# Patient Record
Sex: Male | Born: 1980 | Race: White | Hispanic: No | Marital: Married | Smoking: Never smoker
Health system: Southern US, Community
[De-identification: ages and names within clinical notes are randomized; demographics above are authoritative.]

## PROBLEM LIST (undated history)

## (undated) DIAGNOSIS — R194 Change in bowel habit: Secondary | ICD-10-CM

## (undated) DIAGNOSIS — R197 Diarrhea, unspecified: Secondary | ICD-10-CM

## (undated) DIAGNOSIS — R109 Unspecified abdominal pain: Secondary | ICD-10-CM

## (undated) DIAGNOSIS — I10 Essential (primary) hypertension: Secondary | ICD-10-CM

## (undated) DIAGNOSIS — K5900 Constipation, unspecified: Secondary | ICD-10-CM

## (undated) DIAGNOSIS — L509 Urticaria, unspecified: Secondary | ICD-10-CM

## (undated) HISTORY — DX: Urticaria, unspecified: L50.9

## (undated) HISTORY — DX: Constipation, unspecified: K59.00

## (undated) HISTORY — DX: Change in bowel habit: R19.4

## (undated) HISTORY — DX: Unspecified abdominal pain: R10.9

## (undated) HISTORY — PX: CLOSED REDUCTION WRIST FRACTURE: SHX1091

## (undated) HISTORY — DX: Diarrhea, unspecified: R19.7

## (undated) HISTORY — DX: Essential (primary) hypertension: I10

---

## 2005-03-29 ENCOUNTER — Emergency Department (HOSPITAL_COMMUNITY): Admission: EM | Admit: 2005-03-29 | Discharge: 2005-03-29 | Payer: Self-pay | Admitting: Emergency Medicine

## 2009-04-05 HISTORY — PX: VARICOCELECTOMY: SHX1084

## 2009-07-29 ENCOUNTER — Ambulatory Visit (HOSPITAL_COMMUNITY): Admission: RE | Admit: 2009-07-29 | Discharge: 2009-07-29 | Payer: Self-pay | Admitting: Internal Medicine

## 2009-09-03 ENCOUNTER — Ambulatory Visit (HOSPITAL_BASED_OUTPATIENT_CLINIC_OR_DEPARTMENT_OTHER): Admission: RE | Admit: 2009-09-03 | Discharge: 2009-09-03 | Payer: Self-pay | Admitting: Urology

## 2014-10-01 ENCOUNTER — Ambulatory Visit (INDEPENDENT_AMBULATORY_CARE_PROVIDER_SITE_OTHER)
Admission: RE | Admit: 2014-10-01 | Discharge: 2014-10-01 | Disposition: A | Discharge status: Home / Self Care | Payer: BC Managed Care – PPO | Source: Ambulatory Visit | Attending: Family Medicine | Admitting: Family Medicine

## 2014-10-01 ENCOUNTER — Other Ambulatory Visit (INDEPENDENT_AMBULATORY_CARE_PROVIDER_SITE_OTHER): Payer: BC Managed Care – PPO

## 2014-10-01 ENCOUNTER — Encounter: Payer: Self-pay | Admitting: Family Medicine

## 2014-10-01 ENCOUNTER — Ambulatory Visit (INDEPENDENT_AMBULATORY_CARE_PROVIDER_SITE_OTHER): Payer: BC Managed Care – PPO | Admitting: Family Medicine

## 2014-10-01 VITALS — BP 134/80 | HR 79 | Ht 77.0 in | Wt 319.0 lb

## 2014-10-01 DIAGNOSIS — M25531 Pain in right wrist: Secondary | ICD-10-CM | POA: Diagnosis not present

## 2014-10-01 DIAGNOSIS — S62111A Displaced fracture of triquetrum [cuneiform] bone, right wrist, initial encounter for closed fracture: Secondary | ICD-10-CM

## 2014-10-01 DIAGNOSIS — S62113A Displaced fracture of triquetrum [cuneiform] bone, unspecified wrist, initial encounter for closed fracture: Secondary | ICD-10-CM | POA: Insufficient documentation

## 2014-10-01 NOTE — Assessment & Plan Note (Signed)
Patient was put in a removable splint at this time. We discussed about proper casting the patient declined. We will get x-rays to further evaluate to make sure there is no significant displacement. Patient will likely do well with immobilization. We discussed icing protocol, mild range of motion exercises, as well as vitamin D supplementation. Prognosis is good.

## 2014-10-01 NOTE — Progress Notes (Signed)
Pre visit review using our clinic review tool, if applicable. No additional management support is needed unless otherwise documented below in the visit note. 

## 2014-10-01 NOTE — Progress Notes (Signed)
Mario Kelley D.O. [facility] [address] [place], [place] [address] Phone: [phone] Subjective:     CC: Right wrist pain  OZH:YQMVHQIONGHPI:Subjective Mario Kelley is a 34 y.o. male coming in with complaint of right wrist pain. Patient states that about 8 weeks ago he was holding hands with his wife and hit it against a barrel. Patient states that this started hurt at that time. Since then anytime he tries to put pressure on it with the wrist in full extension he has pain. Mostly on the dorsal aspect of the wrist. No swelling, no numbness and tingling. he states though that unfortunately and does not seem to be improving quickly. Rates the severity of pain as 5 out of 10. States that it sometimes gives him some discomfort with work but not truly pain. Patient used to be a significant more active before the injury. Patient has seen a physical therapist and since then has not notice any improvement.  Past Medical History  Diagnosis Date  . Hypertension    Past Surgical History  Procedure Laterality Date  . Varicocelectomy  2011   History  Substance Use Topics  . Smoking status: Never Smoker   . Smokeless tobacco: Never Used  . Alcohol Use: No   History reviewed. No pertinent family history. Not on File     Past medical history, social, surgical and family history all reviewed in electronic medical record.   Review of Systems: No headache, visual changes, nausea, vomiting, diarrhea, constipation, dizziness, abdominal pain, skin rash, fevers, chills, night sweats, weight loss, swollen lymph nodes, body aches, joint swelling, muscle aches, chest pain, shortness of breath, mood changes.   Objective Blood pressure 134/80, pulse 79, height 6\' 5"  (1.956 m), weight 319 lb (144.697 kg), SpO2 97 %.  General: No apparent distress alert and oriented x3 mood and affect normal, dressed appropriately.  HEENT: Pupils equal, extraocular movements intact  Respiratory: Patient's speak in  full sentences and does not appear short of breath  Cardiovascular: No lower extremity edema, non tender, no erythema  Skin: Warm dry intact with no signs of infection or rash on extremities or on axial skeleton.  Abdomen: Soft nontender  Neuro: Cranial nerves II through XII are intact, neurovascularly intact in all extremities with 2+ DTRs and 2+ pulses.  Lymph: No lymphadenopathy of posterior or anterior cervical chain or axillae bilaterally.  Gait normal with good balance and coordination.  MSK:  Non tender with full range of motion and good stability and symmetric strength and tone of shoulders, elbows, hip, knee and ankles bilaterally.  Wrist: right Inspection normal with no visible erythema or swelling. ROM smooth and normal with good flexion and extension and ulnar/radial deviation that is symmetrical with opposite wrist. Palpation is normal over metacarpals, navicular, lunate, and TFCC; tendons without tenderness/ swelling but mild pain over triquetrum.  No snuffbox tenderness. No tenderness over Canal of Guyon. Strength 5/5 in all directions without pain. Negative Finkelstein, tinel's and phalens. Negative Watson's test.   MSK US performed of: right This study was ordered, performed, and interpreted by Terrilee FilesZach Waynesha Rammel D.O.  Wrist: All extensor compartments visualized and tendons all normal in appearance without fraying, tears, or sheath effusions. Patient does have triquetrum does have what appears to be a full bone cortical defect with some mild displaced. Hypoechoic changes surrounding the area with minimal Doppler flow. No effusion seen. TFCC intact. Scapholunate ligament intact. Carpal tunnel visualized and median nerve area normal, flexor tendons all normal in  appearance without fraying, tears, or sheath effusions.   IMPRESSION: triquetrum fracture,      Impression and Recommendations:     This case required medical decision making of moderate complexity.

## 2014-10-01 NOTE — Patient Instructions (Addendum)
Good to see you.  Ice 20 minutes 2 times daily. Usually after activity and before bed. Wear brace with activity and at night.  Can come out of brace 1-2 times to move wrist daily Try to avoid loading wrist for next 3 weeks without brace.  See me again in 3 weeks before vacation.

## 2014-10-22 ENCOUNTER — Encounter: Payer: Self-pay | Admitting: Family Medicine

## 2014-10-22 ENCOUNTER — Ambulatory Visit (INDEPENDENT_AMBULATORY_CARE_PROVIDER_SITE_OTHER): Payer: BC Managed Care – PPO | Admitting: Family Medicine

## 2014-10-22 VITALS — BP 138/82 | HR 65 | Wt 311.0 lb

## 2014-10-22 DIAGNOSIS — M25531 Pain in right wrist: Secondary | ICD-10-CM

## 2014-10-22 DIAGNOSIS — S62111D Displaced fracture of triquetrum [cuneiform] bone, right wrist, subsequent encounter for fracture with routine healing: Secondary | ICD-10-CM | POA: Diagnosis not present

## 2014-10-22 NOTE — Patient Instructions (Signed)
Good to see you Doing great Wear brace out of the house for 2 weeks Sleep in it for next 5 days OK not to wear it in the house Continue vitamin D Start lifting at 25% in 1-2 weeks and increase 10% a week See me again in 3 weeks if not perfect

## 2014-10-22 NOTE — Progress Notes (Signed)
  Tawana ScaleZach Kelley D.O. [facility] [address] [place], [place] [address] Phone: [phone] Subjective:     CC: Right wrist pain  BJY:NWGNFAOZHYHPI:Subjective Baird KayJerry Wayne Kelley is a 34 y.o. male coming in with complaint of right wrist pain. Patient did have a triquetrum fracture. Patient is feeling better but has some soreness as he wears the brace. Patient has been wearing the brace diligently over the course last 3 weeks. Denies any new symptoms.  Past Medical History  Diagnosis Date  . Hypertension    Past Surgical History  Procedure Laterality Date  . Varicocelectomy  2011   History  Substance Use Topics  . Smoking status: Never Smoker   . Smokeless tobacco: Never Used  . Alcohol Use: No   No family history on file. Not on File     Past medical history, social, surgical and family history all reviewed in electronic medical record.   Review of Systems: No headache, visual changes, nausea, vomiting, diarrhea, constipation, dizziness, abdominal pain, skin rash, fevers, chills, night sweats, weight loss, swollen lymph nodes, body aches, joint swelling, muscle aches, chest pain, shortness of breath, mood changes.   Objective Blood pressure 138/82, pulse 65, weight 311 lb (141.069 kg), SpO2 99 %.  General: No apparent distress alert and oriented x3 mood and affect normal, dressed appropriately.  HEENT: Pupils equal, extraocular movements intact  Respiratory: Patient's speak in full sentences and does not appear short of breath  Cardiovascular: No lower extremity edema, non tender, no erythema  Skin: Warm dry intact with no signs of infection or rash on extremities or on axial skeleton.  Abdomen: Soft nontender  Neuro: Cranial nerves II through XII are intact, neurovascularly intact in all extremities with 2+ DTRs and 2+ pulses.  Lymph: No lymphadenopathy of posterior or anterior cervical chain or axillae bilaterally.  Gait normal with good balance and coordination.  MSK:  Non  tender with full range of motion and good stability and symmetric strength and tone of shoulders, elbows, hip, knee and ankles bilaterally.  Wrist: right Inspection normal with no visible erythema or swelling. ROM smooth and normal with good flexion and extension and ulnar/radial deviation that is symmetrical with opposite wrist. Palpation is normal over metacarpals, navicular, lunate, and TFCC; tendons without tenderness nontender over the triquetrum today No snuffbox tenderness. No tenderness over Canal of Guyon. Strength 5/5 in all directions without pain. Negative Finkelstein, tinel's and phalens. Negative Watson's test.   MSK US performed of: right This study was ordered, performed, and interpreted by Terrilee FilesZach Kelley D.O.  Wrist: All extensor compartments visualized and tendons all normal in appearance without fraying, tears, or sheath effusions. Patient on longer has a court or defect over the triquetrum. Patient does have reabsorption occurring. Mild overlying hypoechoic changes still exists. No effusion seen. TFCC intact. Scapholunate ligament intact. Carpal tunnel visualized and median nerve area normal, flexor tendons all normal in appearance without fraying, tears, or sheath effusions.   IMPRESSION: Triquetrum fracture healing     Impression and Recommendations:     This case required medical decision making of moderate complexity.

## 2014-10-22 NOTE — Assessment & Plan Note (Addendum)
Condition is feeling much better. Patient was given an exercise prescription will start wearing the immobilizing brace less and less frequently. Patient is over the course of next 3 weeks. Continue the vitamin D supplementation as well as icing. Patient will follow-up with me again in 3 weeks if not pain-free.

## 2014-11-12 ENCOUNTER — Ambulatory Visit: Payer: BC Managed Care – PPO | Admitting: Family Medicine

## 2015-05-06 ENCOUNTER — Other Ambulatory Visit (HOSPITAL_COMMUNITY): Payer: Self-pay | Admitting: Internal Medicine

## 2015-05-06 DIAGNOSIS — R109 Unspecified abdominal pain: Secondary | ICD-10-CM

## 2015-05-09 ENCOUNTER — Ambulatory Visit (HOSPITAL_COMMUNITY)
Admission: RE | Admit: 2015-05-09 | Discharge: 2015-05-09 | Disposition: A | Discharge status: Home / Self Care | Payer: BC Managed Care – PPO | Source: Ambulatory Visit | Attending: Internal Medicine | Admitting: Internal Medicine

## 2015-05-09 DIAGNOSIS — R109 Unspecified abdominal pain: Secondary | ICD-10-CM | POA: Diagnosis present

## 2015-05-15 ENCOUNTER — Other Ambulatory Visit (HOSPITAL_COMMUNITY): Payer: Self-pay | Admitting: Internal Medicine

## 2015-05-15 DIAGNOSIS — R109 Unspecified abdominal pain: Secondary | ICD-10-CM

## 2015-06-02 ENCOUNTER — Encounter (HOSPITAL_COMMUNITY)
Admission: RE | Admit: 2015-06-02 | Discharge: 2015-06-02 | Disposition: A | Discharge status: Home / Self Care | Payer: BC Managed Care – PPO | Source: Ambulatory Visit | Attending: Internal Medicine | Admitting: Internal Medicine

## 2015-06-02 DIAGNOSIS — R109 Unspecified abdominal pain: Secondary | ICD-10-CM | POA: Insufficient documentation

## 2015-06-02 MED ORDER — SINCALIDE 5 MCG IJ SOLR
0.0200 ug/kg | Freq: Once | INTRAMUSCULAR | Status: AC
  Administered 2015-06-02: 2 ug via INTRAVENOUS

## 2015-06-02 MED ORDER — TECHNETIUM TC 99M MEBROFENIN IV KIT
5.0000 | PACK | Freq: Once | INTRAVENOUS | Status: AC | PRN
  Administered 2015-06-02: 5 via INTRAVENOUS

## 2015-06-06 ENCOUNTER — Encounter: Payer: Self-pay | Admitting: Internal Medicine

## 2015-06-23 ENCOUNTER — Encounter: Payer: Self-pay | Admitting: Gastroenterology

## 2015-06-24 ENCOUNTER — Ambulatory Visit: Payer: BC Managed Care – PPO | Admitting: Gastroenterology

## 2015-08-12 ENCOUNTER — Encounter: Payer: Self-pay | Admitting: Gastroenterology

## 2015-08-12 ENCOUNTER — Other Ambulatory Visit (INDEPENDENT_AMBULATORY_CARE_PROVIDER_SITE_OTHER): Payer: BC Managed Care – PPO

## 2015-08-12 ENCOUNTER — Ambulatory Visit (INDEPENDENT_AMBULATORY_CARE_PROVIDER_SITE_OTHER): Payer: BC Managed Care – PPO | Admitting: Gastroenterology

## 2015-08-12 VITALS — BP 148/90 | HR 88 | Ht 77.0 in | Wt 326.8 lb

## 2015-08-12 DIAGNOSIS — K5900 Constipation, unspecified: Secondary | ICD-10-CM

## 2015-08-12 DIAGNOSIS — R197 Diarrhea, unspecified: Secondary | ICD-10-CM | POA: Diagnosis not present

## 2015-08-12 DIAGNOSIS — R109 Unspecified abdominal pain: Secondary | ICD-10-CM

## 2015-08-12 LAB — CBC WITH DIFFERENTIAL/PLATELET
Basophils Absolute: 0 10*3/uL (ref 0.0–0.1)
Basophils Relative: 0.4 % (ref 0.0–3.0)
EOS PCT: 0.5 % (ref 0.0–5.0)
Eosinophils Absolute: 0 10*3/uL (ref 0.0–0.7)
HEMATOCRIT: 47.3 % (ref 39.0–52.0)
HEMOGLOBIN: 16 g/dL (ref 13.0–17.0)
LYMPHS PCT: 18.6 % (ref 12.0–46.0)
Lymphs Abs: 1.6 10*3/uL (ref 0.7–4.0)
MCHC: 33.9 g/dL (ref 30.0–36.0)
MCV: 82.4 fl (ref 78.0–100.0)
MONOS PCT: 6.6 % (ref 3.0–12.0)
Monocytes Absolute: 0.6 10*3/uL (ref 0.1–1.0)
Neutro Abs: 6.5 10*3/uL (ref 1.4–7.7)
Neutrophils Relative %: 73.9 % (ref 43.0–77.0)
Platelets: 214 10*3/uL (ref 150.0–400.0)
RBC: 5.74 Mil/uL (ref 4.22–5.81)
RDW: 15 % (ref 11.5–15.5)
WBC: 8.8 10*3/uL (ref 4.0–10.5)

## 2015-08-12 LAB — COMPREHENSIVE METABOLIC PANEL
ALBUMIN: 4.5 g/dL (ref 3.5–5.2)
ALK PHOS: 72 U/L (ref 39–117)
ALT: 37 U/L (ref 0–53)
AST: 24 U/L (ref 0–37)
BILIRUBIN TOTAL: 0.6 mg/dL (ref 0.2–1.2)
BUN: 13 mg/dL (ref 6–23)
CO2: 27 mEq/L (ref 19–32)
Calcium: 9.8 mg/dL (ref 8.4–10.5)
Chloride: 101 mEq/L (ref 96–112)
Creatinine, Ser: 1 mg/dL (ref 0.40–1.50)
GFR: 90.55 mL/min (ref >60.00)
Glucose, Bld: 106 mg/dL — ABNORMAL HIGH (ref 70–99)
POTASSIUM: 3.6 meq/L (ref 3.5–5.1)
Sodium: 139 mEq/L (ref 135–145)
TOTAL PROTEIN: 7.6 g/dL (ref 6.0–8.3)

## 2015-08-12 LAB — IGA: IGA: 119 mg/dL (ref 68–378)

## 2015-08-12 LAB — TSH: TSH: 1.37 u[IU]/mL (ref 0.35–4.50)

## 2015-08-12 NOTE — Progress Notes (Signed)
HPI: This is a  very pleasant 35 year old man    who was referred to me by Benita StabileHall, John Z, MD  to evaluate  bowel changes, abdominal pains .    Chief complaint is bowel changes, abdominal pains  This past fall he would have  Intermittent RUQ pains.  Then noticed change in his bowel habits; 6-7 BMs daily.    Can eat rice, chicken.  Will drink protein  No overt blood in his stool.  Most days he will have 6-7 stools per day.  All in varying color.  Can be narrow.  Overall his weight has fluctuated.  04/2015 US of GB was normal. 04/2015 HIDA of GB was normal.  Was on b12 shots previously    His mother had GB issues.  Aunt also.  He's had some nocturnal loose stools.    Has has vomiting.   No stool testing.  Not avid camper.  No similar illnesses in close contacts.  Review of systems: Pertinent positive and negative review of systems were noted in the above HPI section. Complete review of systems was performed and was otherwise normal.   Past Medical History  Diagnosis Date  . Hypertension     Past Surgical History  Procedure Laterality Date  . Varicocelectomy  2011    Current Outpatient Prescriptions  Medication Sig Dispense Refill  . olmesartan-hydrochlorothiazide (BENICAR HCT) 20-12.5 MG tablet Take by mouth.    . testosterone cypionate (DEPOTESTOTERONE CYPIONATE) 100 MG/ML injection Inject 200 mg into the muscle every 14 (fourteen) days. For IM use only     No current facility-administered medications for this visit.    Allergies as of 08/12/2015  . (No Known Allergies)    Family History  Problem Relation Age of Onset  . Diabetes Father   . Diabetes Sister   . Heart disease Paternal Aunt   . Diabetes Maternal Grandfather   . Diabetes Paternal Grandmother     Social History   Social History  . Marital Status: Married    Spouse Name: N/A  . Number of Children: 2  . Years of Education: N/A   Occupational History  . IT    Social History Main  Topics  . Smoking status: Never Smoker   . Smokeless tobacco: Never Used  . Alcohol Use: No  . Drug Use: No  . Sexual Activity: Not on file   Other Topics Concern  . Not on file   Social History Narrative     Physical Exam: BP 148/90 mmHg  Pulse 88  Ht 6\' 5"  (1.956 m)  Wt 326 lb 12.8 oz (148.236 kg)  BMI 38.75 kg/m2 Constitutional: generally well-appearing Psychiatric: alert and oriented x3 Eyes: extraocular movements intact Mouth: oral pharynx moist, no lesions Neck: supple no lymphadenopathy Cardiovascular: heart regular rate and rhythm Lungs: clear to auscultation bilaterally Abdomen: soft, nontender, nondistended, no obvious ascites, no peritoneal signs, normal bowel sounds Extremities: no lower extremity edema bilaterally Skin: no lesions on visible extremities   Assessment and plan: 35 y.o. male with  bowel changes, abdominal pains  Really unclear etiology here. Perhaps chronic infection, perhaps celiac sprue, possibly dietary intolerance. Like to start with some basic blood in stool testing, see those listed under instructions. If these are all unrevealing I think we'll need to proceed with colonoscopy and probably upper endoscopy as well. IBD can present similarly.   Rob Buntinganiel Luther Newhouse, MD [facility] 08/12/2015, 2:30 PM  Cc: Benita StabileHall, John Z, MD

## 2015-08-12 NOTE — Patient Instructions (Signed)
You will have labs checked today in the basement lab.  Please head down after you check out with the front desk  (cbc, cmet, IgA related tTG, total IgA level, THS; stool for ova, parasites, giardia, routine culture). If no clear answers then you will likely need EGD, colonoscopy.

## 2015-08-13 ENCOUNTER — Other Ambulatory Visit: Payer: BC Managed Care – PPO

## 2015-08-13 ENCOUNTER — Other Ambulatory Visit: Payer: Self-pay | Admitting: Gastroenterology

## 2015-08-13 DIAGNOSIS — K5900 Constipation, unspecified: Secondary | ICD-10-CM

## 2015-08-13 DIAGNOSIS — R109 Unspecified abdominal pain: Secondary | ICD-10-CM

## 2015-08-13 DIAGNOSIS — R197 Diarrhea, unspecified: Secondary | ICD-10-CM

## 2015-08-13 LAB — TISSUE TRANSGLUTAMINASE, IGA: TISSUE TRANSGLUTAMINASE AB, IGA: 1 U/mL (ref <4)

## 2015-08-14 LAB — OVA AND PARASITE EXAMINATION: OP: NONE SEEN

## 2015-08-17 LAB — STOOL CULTURE

## 2015-08-18 LAB — GIARDIA ANTIGEN

## 2015-09-02 ENCOUNTER — Ambulatory Visit (AMBULATORY_SURGERY_CENTER): Payer: Self-pay

## 2015-09-02 ENCOUNTER — Encounter: Payer: Self-pay | Admitting: Gastroenterology

## 2015-09-02 VITALS — Ht 77.0 in | Wt 327.6 lb

## 2015-09-02 DIAGNOSIS — R197 Diarrhea, unspecified: Secondary | ICD-10-CM

## 2015-09-02 DIAGNOSIS — K5900 Constipation, unspecified: Secondary | ICD-10-CM

## 2015-09-02 DIAGNOSIS — R109 Unspecified abdominal pain: Secondary | ICD-10-CM

## 2015-09-02 MED ORDER — NA SULFATE-K SULFATE-MG SULF 17.5-3.13-1.6 GM/177ML PO SOLN: ORAL | Status: DC

## 2015-09-02 NOTE — Progress Notes (Signed)
Per pt, no allergies to soy or egg products.Pt not taking any weight loss meds or using  O2 at home. 

## 2015-09-09 ENCOUNTER — Encounter: Payer: Self-pay | Admitting: Gastroenterology

## 2015-09-09 ENCOUNTER — Ambulatory Visit (AMBULATORY_SURGERY_CENTER): Payer: BC Managed Care – PPO | Admitting: Gastroenterology

## 2015-09-09 VITALS — BP 107/70 | HR 81 | Temp 99.6°F | Resp 16 | Ht 77.0 in | Wt 327.0 lb

## 2015-09-09 DIAGNOSIS — K2970 Gastritis, unspecified, without bleeding: Secondary | ICD-10-CM

## 2015-09-09 DIAGNOSIS — R197 Diarrhea, unspecified: Secondary | ICD-10-CM | POA: Diagnosis not present

## 2015-09-09 DIAGNOSIS — K2990 Gastroduodenitis, unspecified, without bleeding: Secondary | ICD-10-CM | POA: Diagnosis not present

## 2015-09-09 DIAGNOSIS — R109 Unspecified abdominal pain: Secondary | ICD-10-CM

## 2015-09-09 DIAGNOSIS — K2950 Unspecified chronic gastritis without bleeding: Secondary | ICD-10-CM | POA: Diagnosis not present

## 2015-09-09 MED ORDER — SODIUM CHLORIDE 0.9 % IV SOLN
500.0000 mL | INTRAVENOUS | Status: DC
Start: 2015-09-09 — End: 2015-09-10

## 2015-09-09 NOTE — Progress Notes (Signed)
Called to room to assist during endoscopic procedure.  Patient ID and intended procedure confirmed with present staff. Received instructions for my participation in the procedure from the performing physician.  

## 2015-09-09 NOTE — Op Note (Signed)
[facility] Patient Name: Mario Kelley Procedure Date: 09/09/2015 2:40 PM MRN: [id number] Endoscopist: Rachael Fee , MD Age: 35 Referring MD:  Date of Birth: [birth year] Gender: Male Procedure:                Colonoscopy Indications:              Chronic diarrhea Medicines:                Monitored Anesthesia Care Procedure:                Pre-Anesthesia Assessment:                           - Prior to the procedure, a History and Physical                            was performed, and patient medications and                            allergies were reviewed. The patient's tolerance of                            previous anesthesia was also reviewed. The risks                            and benefits of the procedure and the sedation                            options and risks were discussed with the patient.                            All questions were answered, and informed consent                            was obtained. Prior Anticoagulants: The patient has                            taken no previous anticoagulant or antiplatelet                            agents. ASA Grade Assessment: II - A patient with                            mild systemic disease. After reviewing the risks                            and benefits, the patient was deemed in                            satisfactory condition to undergo the procedure.                           After obtaining informed consent, the colonoscope  was passed under direct vision. Throughout the                            procedure, the patient's blood pressure, pulse, and                            oxygen saturations were monitored continuously. The                            Model CF-HQ190L [id number]) scope was introduced                            through the anus and advanced to the the terminal                            ileum. The colonoscopy was performed without      difficulty. The patient tolerated the procedure                            well. The quality of the bowel preparation was                            good. The terminal ileum, ileocecal valve,                            appendiceal orifice, and rectum were photographed. Scope In: 2:49:43 PM Scope Out: 2:59:54 PM Scope Withdrawal Time: 0 hours 8 minutes 8 seconds  Total Procedure Duration: 0 hours 10 minutes 11 seconds  Findings:                 There were multiple small, soft nodules in terminal                            ileum, ? lymphoid hyperplasia. Biopsies were taken                            with a cold forceps for histology.                           There was mild loss of vascularity in the left                            colon, the colon was otherwise normal. Biopsies                            were taken randomly from the colon and sent to                            pathology.                           The exam was otherwise without abnormality on  direct and retroflexion views. Complications:            No immediate complications. Estimated blood loss:                            None. Estimated Blood Loss:     Estimated blood loss: none. Impression:               There were multiple small, soft nodules in terminal                            ileum, ? lymphoid hyperplasia. Biopsies were taken                            with a cold forceps for histology.                           There was mild loss of vascularity in the left                            colon, the colon was otherwise normal. Biopsies                            were taken randomly from the colon and sent to                            pathology. Recommendation:           - Patient has a contact number available for                            emergencies. The signs and symptoms of potential                            delayed complications were discussed with the                             patient. Return to normal activities tomorrow.                            Written discharge instructions were provided to the                            patient.                           - Resume previous diet.                           - Continue present medications.                           - Await pathology results.                           - For now, please start twice daily imodium on a  scheduled basis. Rachael Fee, MD 09/09/2015 3:09:30 PM This report has been signed electronically.

## 2015-09-09 NOTE — Op Note (Signed)
[facility] Patient Name: Mario Kelley Procedure Date: 09/09/2015 2:38 PM MRN: [id number] Endoscopist: Rachael Feeaniel P Amaru Burroughs , MD Age: 35 Referring MD:  Date of Birth: 06/07/1980 Gender: Male Procedure:                Upper GI endoscopy Indications:              Epigastric abdominal pain Medicines:                Monitored Anesthesia Care Procedure:                Pre-Anesthesia Assessment:                           - Prior to the procedure, a History and Physical                            was performed, and patient medications and                            allergies were reviewed. The patient's tolerance of                            previous anesthesia was also reviewed. The risks                            and benefits of the procedure and the sedation                            options and risks were discussed with the patient.                            All questions were answered, and informed consent                            was obtained. Prior Anticoagulants: The patient has                            taken no previous anticoagulant or antiplatelet                            agents. ASA Grade Assessment: II - A patient with                            mild systemic disease. After reviewing the risks                            and benefits, the patient was deemed in                            satisfactory condition to undergo the procedure.                           After obtaining informed consent, the endoscope was  passed under direct vision. Throughout the                            procedure, the patient's blood pressure, pulse, and                            oxygen saturations were monitored continuously. The                            Model GIF-[id number]) scope was introduced                            through the mouth, and advanced to the second part                            of duodenum. The upper GI endoscopy was                accomplished without difficulty. The patient                            tolerated the procedure well. Scope In: Scope Out: Findings:                 The esophagus was normal.                           Diffuse mild inflammation was found in the gastric                            antrum. Biopsies were taken with a cold forceps for                            histology.                           The exam was otherwise without abnormality. Complications:            No immediate complications. Estimated blood loss:                            None. Estimated Blood Loss:     Estimated blood loss: none. Impression:               - Normal esophagus.                           - Gastritis. Biopsied.                           - The examination was otherwise normal. Recommendation:           - Patient has a contact number available for                            emergencies. The signs and symptoms of potential                            delayed complications  were discussed with the                            patient. Return to normal activities tomorrow.                            Written discharge instructions were provided to the                            patient.                           - Resume previous diet.                           - Continue present medications.                           - Await pathology results. Rachael Fee, MD 09/09/2015 3:11:16 PM This report has been signed electronically.

## 2015-09-09 NOTE — Progress Notes (Signed)
No egg or soy allergy known to patient  No issues with past sedation with any surgeries  or procedures, no intubation problems  No diet pills per patient No home 02 use per patient  No blood thinners per patient  Pt states  issues with constipation alternates with diarrhea x 6 months or so, change in bowel habits - feels constipated but has bm 7-8 times a day

## 2015-09-09 NOTE — Progress Notes (Signed)
Patient awakening,vss,report to rn 

## 2015-09-09 NOTE — Patient Instructions (Signed)
Discharge instructions given. Biopsies taken. Resume previous medications. YOU HAD AN ENDOSCOPIC PROCEDURE TODAY AT THE [facility]:   Refer to the procedure report that was given to you for any specific questions about what was found during the examination.  If the procedure report does not answer your questions, please call your gastroenterologist to clarify.  If you requested that your care partner not be given the details of your procedure findings, then the procedure report has been included in a sealed envelope for you to review at your convenience later.  YOU SHOULD EXPECT: Some feelings of bloating in the abdomen. Passage of more gas than usual.  Walking can help get rid of the air that was put into your GI tract during the procedure and reduce the bloating. If you had a lower endoscopy (such as a colonoscopy or flexible sigmoidoscopy) you may notice spotting of blood in your stool or on the toilet paper. If you underwent a bowel prep for your procedure, you may not have a normal bowel movement for a few days.  Please Note:  You might notice some irritation and congestion in your nose or some drainage.  This is from the oxygen used during your procedure.  There is no need for concern and it should clear up in a day or so.  SYMPTOMS TO REPORT IMMEDIATELY:   Following lower endoscopy (colonoscopy or flexible sigmoidoscopy):  Excessive amounts of blood in the stool  Significant tenderness or worsening of abdominal pains  Swelling of the abdomen that is new, acute  Fever of 100F or higher   Following upper endoscopy (EGD)  Vomiting of blood or coffee ground material  New chest pain or pain under the shoulder blades  Painful or persistently difficult swallowing  New shortness of breath  Fever of 100F or higher  Black, tarry-looking stools  For urgent or emergent issues, a gastroenterologist can be reached at any hour by calling [phone].   DIET: Your first meal  following the procedure should be a small meal and then it is ok to progress to your normal diet. Heavy or fried foods are harder to digest and may make you feel nauseous or bloated.  Likewise, meals heavy in dairy and vegetables can increase bloating.  Drink plenty of fluids but you should avoid alcoholic beverages for 24 hours.  ACTIVITY:  You should plan to take it easy for the rest of today and you should NOT DRIVE or use heavy machinery until tomorrow (because of the sedation medicines used during the test).    FOLLOW UP: Our staff will call the number listed on your records the next business day following your procedure to check on you and address any questions or concerns that you may have regarding the information given to you following your procedure. If we do not reach you, we will leave a message.  However, if you are feeling well and you are not experiencing any problems, there is no need to return our call.  We will assume that you have returned to your regular daily activities without incident.  If any biopsies were taken you will be contacted by phone or by letter within the next 1-3 weeks.  Please call us at [phone] if you have not heard about the biopsies in 3 weeks.    SIGNATURES/CONFIDENTIALITY: You and/or your care partner have signed paperwork which will be entered into your electronic medical record.  These signatures attest to the fact that that the information above   on your After Visit Summary has been reviewed and is understood.  Full responsibility of the confidentiality of this discharge information lies with you and/or your care-partner. 

## 2015-09-10 ENCOUNTER — Telehealth: Payer: Self-pay | Admitting: *Deleted

## 2015-09-10 NOTE — Telephone Encounter (Signed)
  Follow up Call-  Call back number 09/09/2015  Post procedure Call Back phone  # [phone]  Permission to leave phone message Yes    [facility]

## 2015-09-18 ENCOUNTER — Encounter: Payer: Self-pay | Admitting: Gastroenterology

## 2015-09-19 ENCOUNTER — Telehealth: Payer: Self-pay

## 2015-09-19 ENCOUNTER — Encounter: Payer: Self-pay | Admitting: Gastroenterology

## 2015-09-19 NOTE — Telephone Encounter (Signed)
Please advise, Dr Christella HartiganJacobs this message was sent to you from the patient.  Do you have any further recommendations?

## 2015-09-19 NOTE — Telephone Encounter (Signed)
Dr Jacobs have you reviewed this path? 

## 2015-09-19 NOTE — Telephone Encounter (Signed)
Dr Christella HartiganJacobs.         I have a couple questions regarding my colonoscopy/endoscopy results. The nurse called me today and explained that my biopsies were normal. If that's the case, why was there inflammation? Also, I am still having pain in the sides of my abdomen and unusual/frequent bowel movements. What could be the cause? I know something is wrong.        Thanks for your time.        Mario Kelley

## 2015-09-22 NOTE — Telephone Encounter (Signed)
The inflammation was mild gastritis, can be related to acid in stomach, intermittent NSAID or ASA use, sometimes Etoh related. It was mild and since no H. Pylori i don't think it is related to his abd pains.  The colon was normal, biopsies were normal.  Has he tried the imodium as recommended?  I think next test is CT scan abd/pelvis for his abd pains.  Thanks

## 2015-09-23 NOTE — Telephone Encounter (Signed)
Pt aware and will be set up for CT I will send instructions via My Chart per pt request

## 2015-09-24 ENCOUNTER — Encounter: Payer: Self-pay | Admitting: Gastroenterology

## 2015-09-24 NOTE — Telephone Encounter (Signed)
See My Chart message pt does not want to have any procedures at this time and is requesting appt with Dr Christella HartiganJacobs.  Appt has been made and sent to pt via My Chart

## 2015-12-10 ENCOUNTER — Encounter: Payer: Self-pay | Admitting: Gastroenterology

## 2015-12-10 ENCOUNTER — Ambulatory Visit (INDEPENDENT_AMBULATORY_CARE_PROVIDER_SITE_OTHER): Payer: BC Managed Care – PPO | Admitting: Gastroenterology

## 2015-12-10 VITALS — BP 152/96 | HR 84 | Ht 77.0 in | Wt 329.8 lb

## 2015-12-10 DIAGNOSIS — K2990 Gastroduodenitis, unspecified, without bleeding: Secondary | ICD-10-CM

## 2015-12-10 DIAGNOSIS — K2970 Gastritis, unspecified, without bleeding: Secondary | ICD-10-CM

## 2015-12-10 DIAGNOSIS — R197 Diarrhea, unspecified: Secondary | ICD-10-CM | POA: Diagnosis not present

## 2015-12-10 DIAGNOSIS — R109 Unspecified abdominal pain: Secondary | ICD-10-CM

## 2015-12-10 NOTE — Patient Instructions (Addendum)
You will be set up for a CT scan of abdomen and pelvis with IV and oral contrast for abd pain, nausea, loose stools. If the CT scan does not show clear answer, then likely will put you on empiric antibiotics (flagyl).   You have been scheduled for a CT scan of the abdomen and pelvis at [facility] ([address]---this is in the same building as [facility]).   You are scheduled on 12/12/15 at 10 am. You should arrive 15 minutes prior to your appointment time for registration. Please follow the written instructions below on the day of your exam:  WARNING: IF YOU ARE ALLERGIC TO IODINE/X-RAY DYE, PLEASE NOTIFY RADIOLOGY IMMEDIATELY AT [phone]! YOU WILL BE GIVEN A 13 HOUR PREMEDICATION PREP.  1) Do not eat or drink anything after  6 am(4 hours prior to your test) 2) You have been given 2 bottles of oral contrast to drink. The solution may taste better if refrigerated, but do NOT add ice or any other liquid to this solution. Shake well before drinking.    Drink 1 bottle of contrast @ 8 am (2 hours prior to your exam)  Drink 1 bottle of contrast @  9 am(1 hour prior to your exam)  You may take any medications as prescribed with a small amount of water except for the following: Metformin, Glucophage, Glucovance, Avandamet, Riomet, Fortamet, Actoplus Met, Janumet, Glumetza or Metaglip. The above medications must be held the day of the exam AND 48 hours after the exam.  The purpose of you drinking the oral contrast is to aid in the visualization of your intestinal tract. The contrast solution may cause some diarrhea. Before your exam is started, you will be given a small amount of fluid to drink. Depending on your individual set of symptoms, you may also receive an intravenous injection of x-ray contrast/dye. Plan on being at [facility] for 30 minutes or longer, depending on the type of exam you are having performed.  This test typically takes 30-45 minutes to  complete.  If you have any questions regarding your exam or if you need to reschedule, you may call the CT department at [phone] between the hours of 8:00 am and 5:00 pm, Monday-Friday.  ________________________________________________________________________

## 2015-12-10 NOTE — Progress Notes (Signed)
Review of pertinent gastrointestinal problems: 1. Intermittent abd pains, intermittent loose stools: 05/2015 US of GB was normal. 05/2015 HIDA of GB was normal.  Blood work (cbc, cmet, tsh, stool culture, celiac sprue panel, giardia ab) all normal 08/2015;  Eventual colonoscopy and EGD Dr. 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 Loss of vascularity left colon; biopsies from both areas were normal; EGD showed mild distal gastritis; biopsies the same without H. Pylori.  HPI: This is a  very pleasant 35 year old man whom I last saw about 3 months ago the time of a colonoscopy and upper endoscopy  Chief complaint is intermittent loose stools, intermittent abdominal pains  Can go a week with only 3 BMS per day, then a week to 2 weeks with feeling sick (can go 8-10 loose stools, never blood stools).  CAn be darkish brown in color.  Greenish, grayish.  1 cup coffee a day.  Drinks a lot of water.  Weight is up 3 pounds since last visit.  When he feels sick he hurts in left and feels swollen in the right side.  Tried  No etoh,   Rare nsaids.  The symptoms are new as of 7-8 months ago.  naueas frequently, no vomiting  ROS: complete GI ROS as described in HPI.  Constitutional:  No unintentional weight loss; overall stable.   Past Medical History:  Diagnosis Date  . Abdominal pain    upper abd and bil side pain  . Bowel habit changes   . Constipation    on and off  . Diarrhea    on and off  . Hypertension     Past Surgical History:  Procedure Laterality Date  . CLOSED REDUCTION WRIST FRACTURE     right wrist  . VARICOCELECTOMY  2011    Current Outpatient Prescriptions  Medication Sig Dispense Refill  . amLODipine-olmesartan (AZOR) 5-20 MG tablet Take 1 tablet by mouth daily.    . pantoprazole (PROTONIX) 40 MG tablet Take 1 tablet by mouth daily.    Marland Kitchen. testosterone cypionate (DEPOTESTOTERONE CYPIONATE) 100 MG/ML injection Inject 200 mg into the muscle every 14  (fourteen) days. For IM use only     No current facility-administered medications for this visit.     Allergies as of 12/10/2015  . (No Known Allergies)    Family History  Problem Relation Age of Onset  . Diabetes Father   . Diabetes Sister   . Heart disease Paternal Aunt   . Diabetes Maternal Grandfather   . Diabetes Paternal Grandmother   . Colon cancer Neg Hx   . Colon polyps Neg Hx   . Esophageal cancer Neg Hx   . Rectal cancer Neg Hx   . Stomach cancer Neg Hx     Social History   Social History  . Marital status: Married    Spouse name: N/A  . Number of children: 2  . Years of education: N/A   Occupational History  . IT    Social History Main Topics  . Smoking status: Never Smoker  . Smokeless tobacco: Never Used  . Alcohol use No  . Drug use: No  . Sexual activity: Not on file   Other Topics Concern  . Not on file   Social History Narrative  . No narrative on file     Physical Exam: BP (!) 152/96 (BP Location: Left Arm, Patient Position: Sitting, Cuff Size: Large)   Pulse 84   Ht 6\' 5"  (1.956 m)   Wt (!) 329  lb 12.8 oz (149.6 kg)   BMI 39.11 kg/m  Constitutional: generally well-appearing Psychiatric: alert and oriented x3 Abdomen: soft, nontender, nondistended, no obvious ascites, no peritoneal signs, normal bowel sounds No peripheral edema noted in lower extremities  Assessment and plan: 35 y.o. male with Chronic intermittent loose stools, chronic intermittent abdominal pains  Ultrasound, HIDA scan, extensive blood tests, stool testing all been negative.  EGD and colonoscopy were both nonrevealing. He did have mild gastritis but I don't think that is really causing his troubles here. Unclear etiology. I recommended he scan abdomen and pelvis with IV and oral contrast and if that is also unrevealing then I would like to try empiric trial of antibiotics, likely Flagyl.   Rob Bunting, MD [facility] 12/10/2015, 9:03 AM

## 2015-12-12 ENCOUNTER — Ambulatory Visit (INDEPENDENT_AMBULATORY_CARE_PROVIDER_SITE_OTHER)
Admission: RE | Admit: 2015-12-12 | Discharge: 2015-12-12 | Disposition: A | Discharge status: Home / Self Care | Payer: BC Managed Care – PPO | Source: Ambulatory Visit | Attending: Gastroenterology | Admitting: Gastroenterology

## 2015-12-12 DIAGNOSIS — R109 Unspecified abdominal pain: Secondary | ICD-10-CM | POA: Diagnosis not present

## 2015-12-12 DIAGNOSIS — K2990 Gastroduodenitis, unspecified, without bleeding: Secondary | ICD-10-CM

## 2015-12-12 DIAGNOSIS — R197 Diarrhea, unspecified: Secondary | ICD-10-CM | POA: Diagnosis not present

## 2015-12-12 DIAGNOSIS — K2970 Gastritis, unspecified, without bleeding: Secondary | ICD-10-CM | POA: Diagnosis not present

## 2015-12-12 MED ORDER — IOPAMIDOL (ISOVUE-300) INJECTION 61%
100.0000 mL | Freq: Once | INTRAVENOUS | Status: AC | PRN
  Administered 2015-12-12: 100 mL via INTRAVENOUS

## 2015-12-16 ENCOUNTER — Encounter: Payer: Self-pay | Admitting: Gastroenterology

## 2015-12-16 ENCOUNTER — Other Ambulatory Visit: Payer: Self-pay

## 2015-12-16 MED ORDER — METRONIDAZOLE 500 MG PO TABS: 500.0000 mg | ORAL_TABLET | Freq: Three times a day (TID) | ORAL | 0 refills | Status: DC

## 2016-01-27 ENCOUNTER — Encounter: Payer: Self-pay | Admitting: Gastroenterology

## 2016-07-03 IMAGING — CR DG WRIST COMPLETE 3+V*R*
4 series · 4 of 4 positions shown · non-contrast
Comparison: None.

CLINICAL DATA: History of triquetrum fracture, wrist swelling

EXAM:
RIGHT WRIST - COMPLETE 3+ VIEW

[view not recorded (1 of 4)]
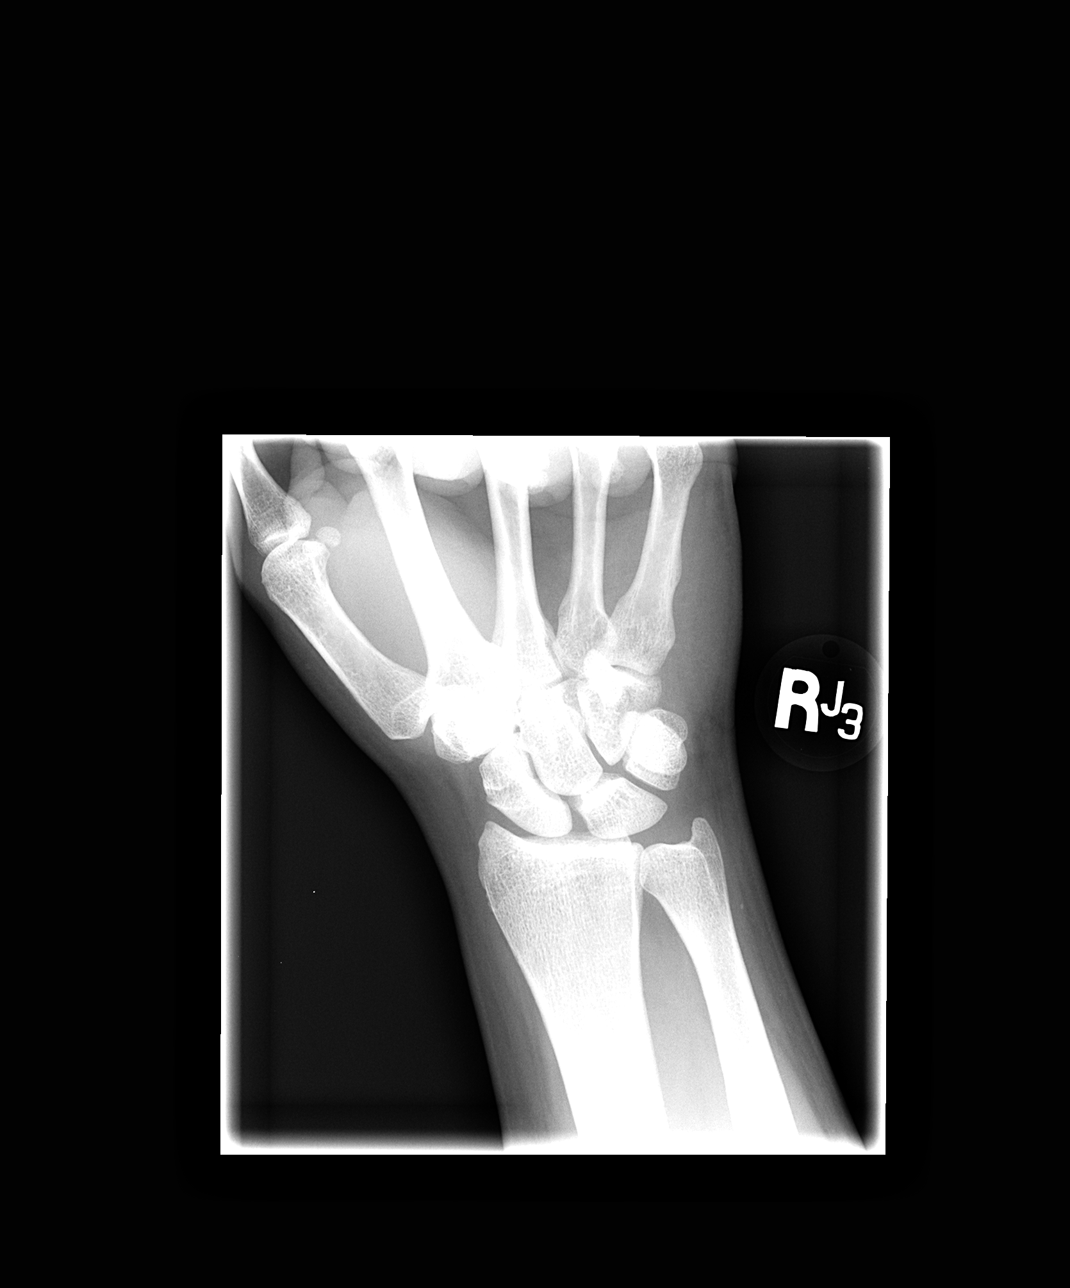

[view not recorded (2 of 4)]
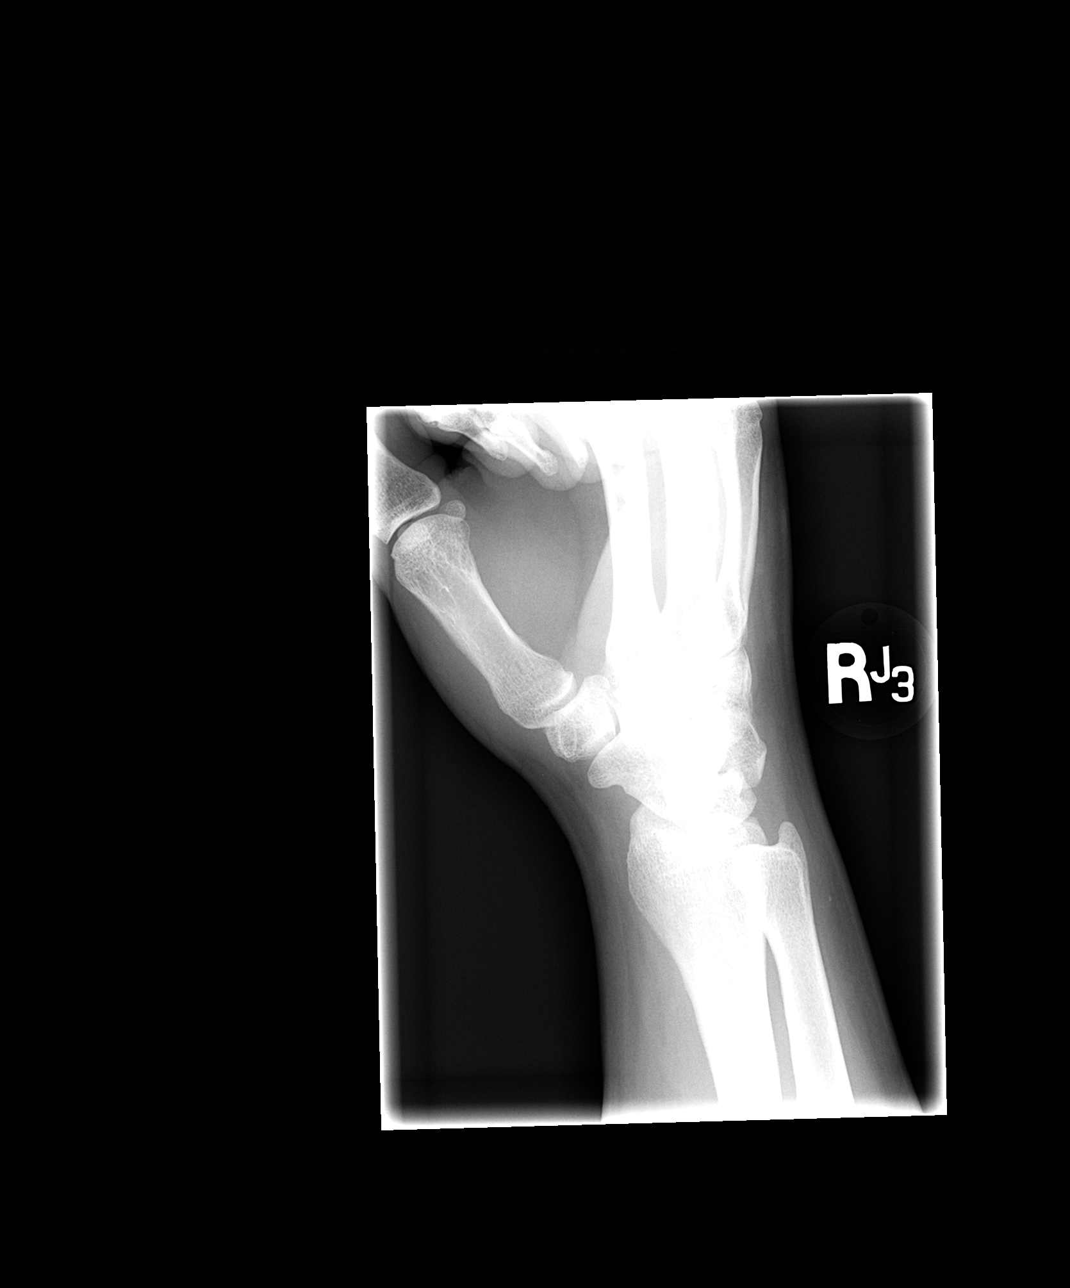

[view not recorded (3 of 4)]
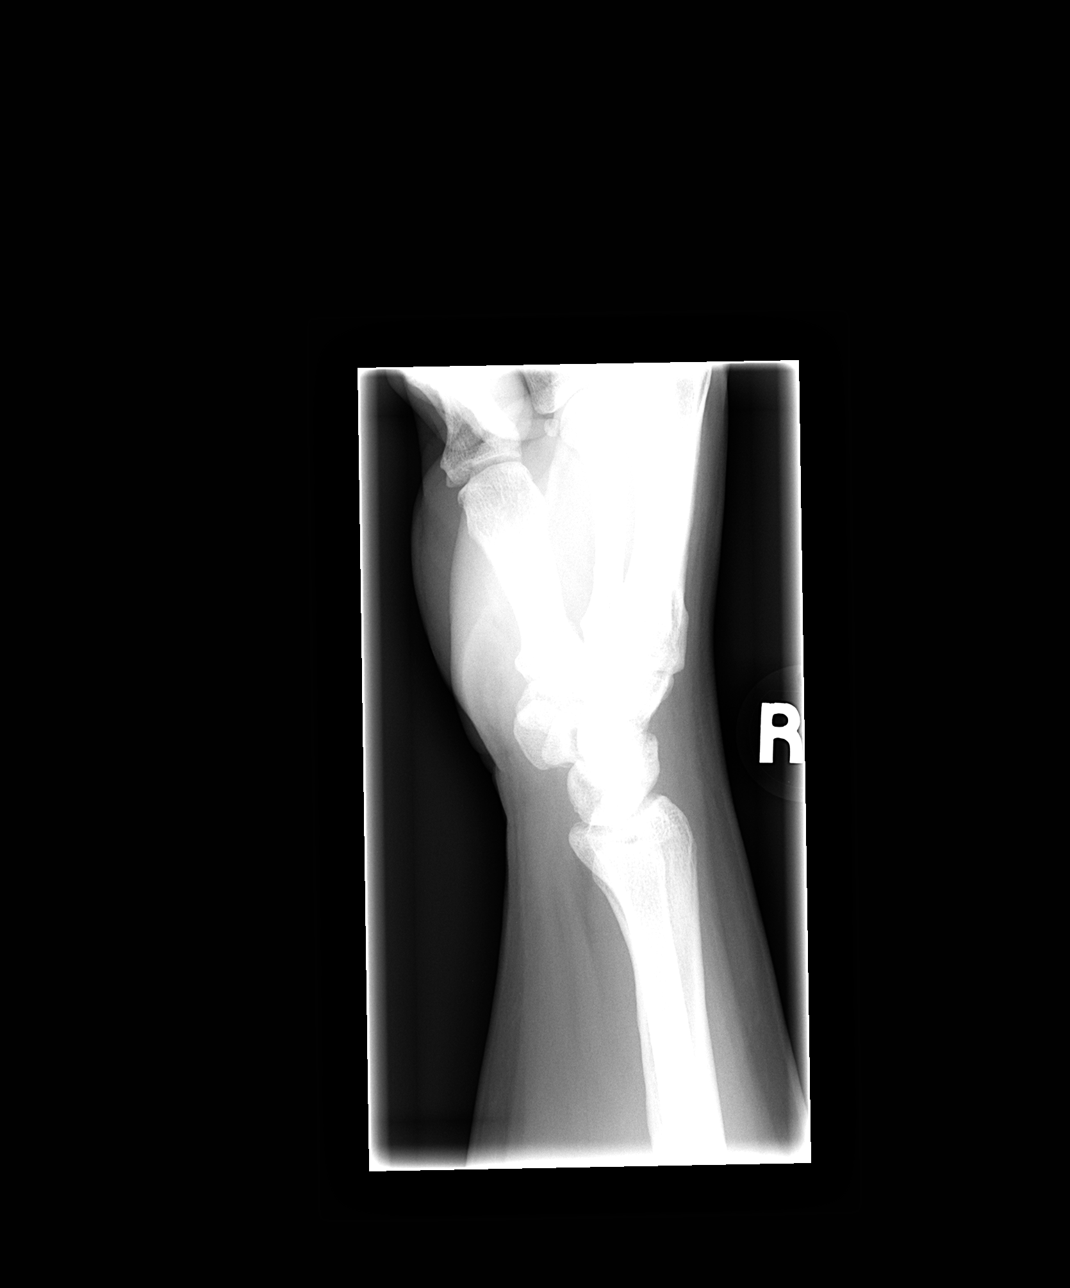

[view not recorded (4 of 4)]
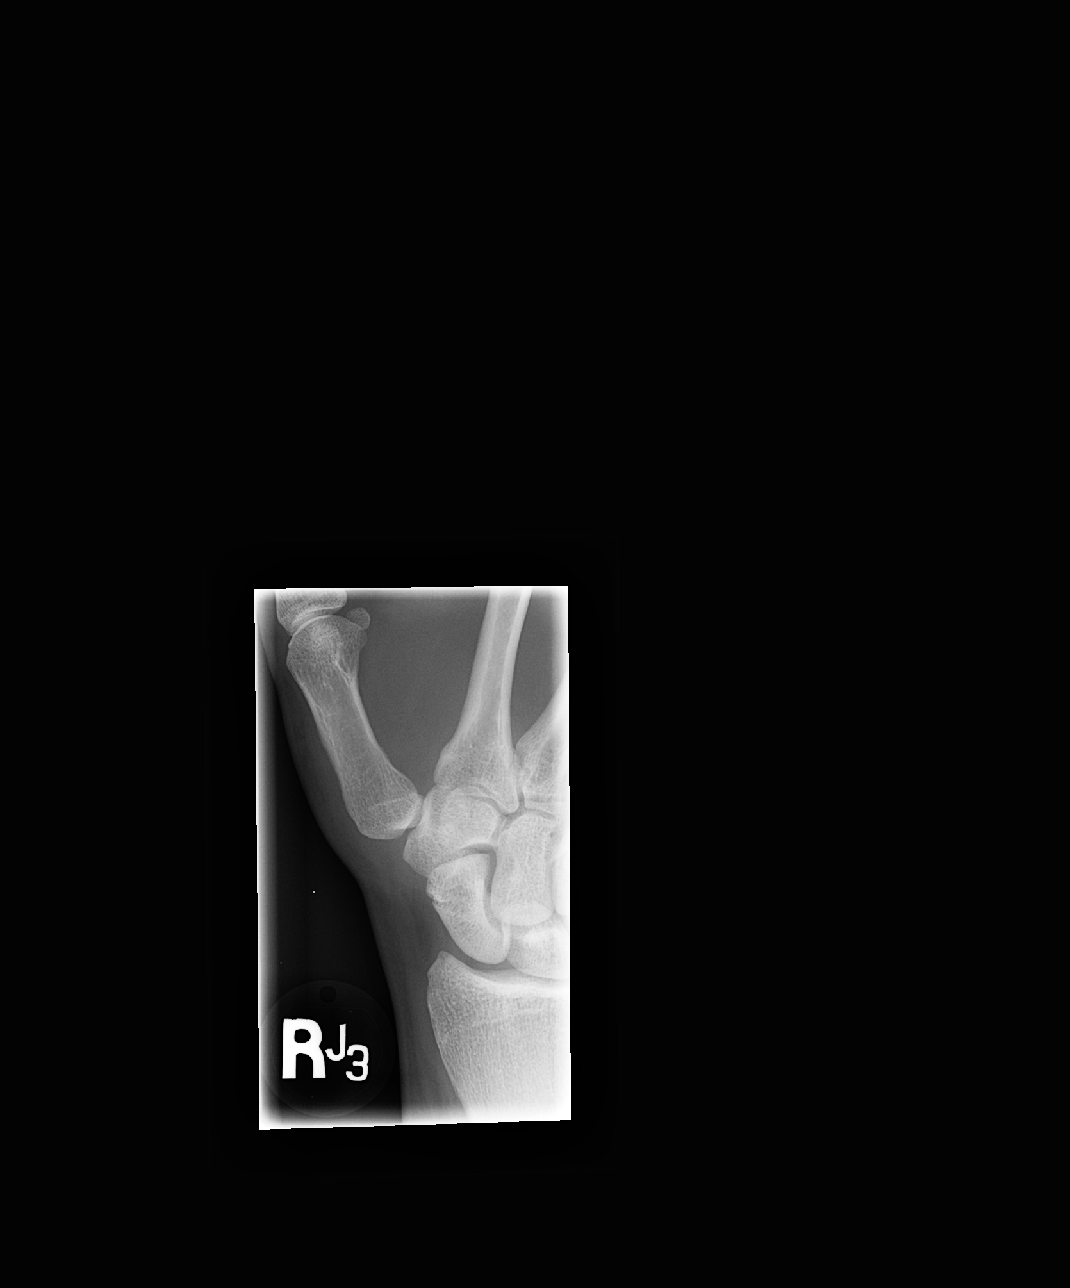

[4 of 4 positions shown; findings below may reference images not displayed]

FINDINGS: There is no evidence of fracture or dislocation. There is no
evidence of arthropathy or other focal bone abnormality. Soft
tissues are unremarkable.
IMPRESSION: No acute abnormality noted.

## 2017-03-04 IMAGING — NM NM HEPATO W/GB/PHARM/[PERSON_NAME]
1 series · 12 of 12 positions shown · non-contrast
Comparison: None.

CLINICAL DATA: Upper abdominal pain

EXAM:
NUCLEAR MEDICINE HEPATOBILIARY IMAGING WITH GALLBLADDER EF
Views: Anterior right upper quadrant
RADIOPHARMACEUTICALS:  5.5 mCi Ic-XXm Choletec IV

[Series 1: hepato · 4.46mm/px · 2 acquisitions, 12 frames shown]
[im 1/2]
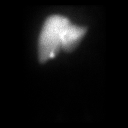
[im 1/2]
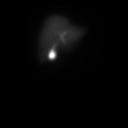
[im 1/2]
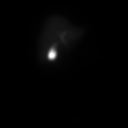
[im 1/2]
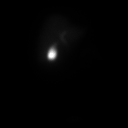
[im 1/2]
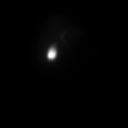
[im 1/2]
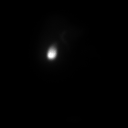
[im 2/2]
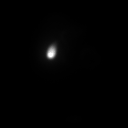
[im 2/2]
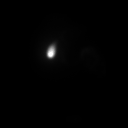
[im 2/2]
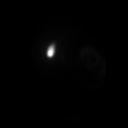
[im 2/2]
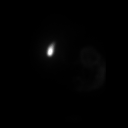
[im 2/2]
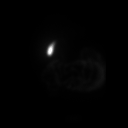
[im 2/2]
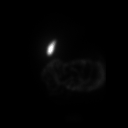

[12 of 12 positions shown; findings below may reference images not displayed]

FINDINGS: Liver uptake of radiotracer is normal. There is prompt visualization
of gallbladder and small bowel, indicating patency of the cystic and
common bile ducts. A weight based dose, 2.0 mcg, of CCK was
administered intravenously with calculation of the computer
generated ejection fraction of radiotracer from the gallbladder. The
patient did not experience symptoms with the CCK administration. The
computer generated ejection fraction of radiotracer from the
gallbladder is normal at 46.3%, normal greater than 38%.
IMPRESSION: Study within normal limits.

## 2019-09-27 ENCOUNTER — Other Ambulatory Visit: Payer: Self-pay

## 2019-09-27 ENCOUNTER — Ambulatory Visit: Payer: BC Managed Care – PPO | Admitting: Podiatry

## 2019-09-27 ENCOUNTER — Ambulatory Visit (INDEPENDENT_AMBULATORY_CARE_PROVIDER_SITE_OTHER): Payer: BC Managed Care – PPO

## 2019-09-27 ENCOUNTER — Encounter: Payer: Self-pay | Admitting: Podiatry

## 2019-09-27 VITALS — Temp 98.1°F | Resp 14

## 2019-09-27 DIAGNOSIS — M7661 Achilles tendinitis, right leg: Secondary | ICD-10-CM | POA: Diagnosis not present

## 2019-09-27 DIAGNOSIS — M7660 Achilles tendinitis, unspecified leg: Secondary | ICD-10-CM

## 2019-09-27 DIAGNOSIS — M778 Other enthesopathies, not elsewhere classified: Secondary | ICD-10-CM

## 2019-09-27 DIAGNOSIS — M216X1 Other acquired deformities of right foot: Secondary | ICD-10-CM

## 2019-09-27 DIAGNOSIS — M21861 Other specified acquired deformities of right lower leg: Secondary | ICD-10-CM

## 2019-09-27 MED ORDER — DICLOFENAC SODIUM 1 % EX GEL: 4.0000 g | Freq: Four times a day (QID) | CUTANEOUS | 2 refills | Status: AC

## 2019-09-27 NOTE — Progress Notes (Signed)
-  Subjective:  Patient ID: Mario Cirri., male    DOB: 02-18-1981,  MRN: [id number]  Chief Complaint  Patient presents with   Plantar Fasciitis    the arch and the back of the achilles tendon is hurting some     39 y.o. male presents with the above complaint.  Dorsal right midfoot pain present for several months.  Achilles tendon posterior pain right foot present for approximate 6 weeks and is intermittent.  Referred to [place] by his PCP Dr. Margo Aye.  Hurts to stretch his foot, especially when a sending descending steps or ladders.  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  Motrin and Mobic are previously not helpful for him.   Review of Systems: Negative except as noted in the HPI. Denies N/V/F/Ch.  Past Medical History:  Diagnosis Date   Abdominal pain    upper abd and bil side pain   Bowel habit changes    Constipation    on and off   Diarrhea    on and off   Hypertension     Current Outpatient Medications:    amLODipine-olmesartan (AZOR) 5-20 MG tablet, Take 1 tablet by mouth daily., Disp: , Rfl:    cetirizine (ZYRTEC) 10 MG tablet, Take by mouth., Disp: , Rfl:    diclofenac Sodium (VOLTAREN) 1 % GEL, Apply 4 g topically 4 (four) times daily. Rub into affected area of foot 2 to 4 times daily, Disp: 100 g, Rfl: 2   pantoprazole (PROTONIX) 40 MG tablet, Take 1 tablet by mouth daily., Disp: , Rfl:    testosterone cypionate (DEPOTESTOTERONE CYPIONATE) 100 MG/ML injection, Inject 200 mg into the muscle every 14 (fourteen) days. For IM use only, Disp: , Rfl:   Social History   Tobacco Use  Smoking Status Never Smoker  Smokeless Tobacco Never Used    No Known Allergies Objective:   Vitals:   09/27/19 1005  Resp: 14  Temp: 98.1 F (36.7 C)   There is no height or weight on file to calculate BMI. Constitutional Well developed. Well nourished.  Vascular Dorsalis pedis pulses palpable bilaterally. Posterior tibial pulses palpable bilaterally. Capillary refill normal  to all digits.  No cyanosis or clubbing noted. Pedal hair growth normal.  Neurologic Normal speech. Oriented to person, place, and time. Epicritic sensation to light touch grossly present bilaterally.  Dermatologic Nails well groomed and normal in appearance. No open wounds. No skin lesions.  Orthopedic: Normal joint ROM without pain or crepitus bilaterally. No visible deformities. Pain with resisted plantarflexion at the ankle Pain at the Achilles insertion, none at the midsubstance or watershed area No pain with calcaneal squeeze right. Ankle ROM diminished range with pain right. Silfverskiold Test: positive right. Additional painful area at midfoot over long extensor tendons.   Radiographs: Taken and reviewed. No acute fractures or dislocations. No evidence of stress fracture.  Plantar heel spur absent. Posterior heel spur present,   Assessment:   1. Achilles tendon pain   2. Achilles tendinitis of right lower extremity   3. Gastrocnemius equinus of right lower extremity   4. Extensor tendonitis of foot    Plan:  Patient was evaluated and treated and all questions answered.  Achilles Tendonitis, right -XR reviewed as above. -Recommend use of Voltaren gel over posterior heel and midfoot. -Reviewed RICE protocol with patient -Recommend use of over-the-counter orthoses -Return in 4 weeks for reevaluation  Sharl Ma, DPM 09/27/2019      Return in about 4 weeks (around  10/25/2019). °

## 2019-09-27 NOTE — Patient Instructions (Signed)
Plantar Fasciitis (Heel Spur Syndrome) with Rehab The plantar fascia is a fibrous, ligament-like, soft-tissue structure that spans the bottom of the foot. Plantar fasciitis is a condition that causes pain in the foot due to inflammation of the tissue. SYMPTOMS   Pain and tenderness on the underneath side of the foot.  Pain that worsens with standing or walking. CAUSES  Plantar fasciitis is caused by irritation and injury to the plantar fascia on the underneath side of the foot. Common mechanisms of injury include:  Direct trauma to bottom of the foot.  Damage to a small nerve that runs under the foot where the main fascia attaches to the heel bone.  Stress placed on the plantar fascia due to bone spurs. RISK INCREASES WITH:   Activities that place stress on the plantar fascia (running, jumping, pivoting, or cutting).  Poor strength and flexibility.  Improperly fitted shoes.  Tight calf muscles.  Flat feet.  Failure to warm-up properly before activity.  Obesity. PREVENTION  Warm up and stretch properly before activity.  Allow for adequate recovery between workouts.  Maintain physical fitness:  Strength, flexibility, and endurance.  Cardiovascular fitness.  Maintain a health body weight.  Avoid stress on the plantar fascia.  Wear properly fitted shoes, including arch supports for individuals who have flat feet.  PROGNOSIS  If treated properly, then the symptoms of plantar fasciitis usually resolve without surgery. However, occasionally surgery is necessary.  RELATED COMPLICATIONS   Recurrent symptoms that may result in a chronic condition.  Problems of the lower back that are caused by compensating for the injury, such as limping.  Pain or weakness of the foot during push-off following surgery.  Chronic inflammation, scarring, and partial or complete fascia tear, occurring more often from repeated injections.  TREATMENT  Treatment initially involves the  use of ice and medication to help reduce pain and inflammation. The use of strengthening and stretching exercises may help reduce pain with activity, especially stretches of the Achilles tendon. These exercises may be performed at home or with a therapist. Your caregiver may recommend that you use heel cups of arch supports to help reduce stress on the plantar fascia. Occasionally, corticosteroid injections are given to reduce inflammation. If symptoms persist for greater than 6 months despite non-surgical (conservative), then surgery may be recommended.   MEDICATION   If pain medication is necessary, then nonsteroidal anti-inflammatory medications, such as aspirin and ibuprofen, or other minor pain relievers, such as acetaminophen, are often recommended.  Do not take pain medication within 7 days before surgery.  Prescription pain relievers may be given if deemed necessary by your caregiver. Use only as directed and only as much as you need.  Corticosteroid injections may be given by your caregiver. These injections should be reserved for the most serious cases, because they may only be given a certain number of times.  HEAT AND COLD  Cold treatment (icing) relieves pain and reduces inflammation. Cold treatment should be applied for 10 to 15 minutes every 2 to 3 hours for inflammation and pain and immediately after any activity that aggravates your symptoms. Use ice packs or massage the area with a piece of ice (ice massage).  Heat treatment may be used prior to performing the stretching and strengthening activities prescribed by your caregiver, physical therapist, or athletic trainer. Use a heat pack or soak the injury in warm water.  SEEK IMMEDIATE MEDICAL CARE IF:  Treatment seems to offer no benefit, or the condition worsens.  Any medications   produce adverse side effects.  EXERCISES- RANGE OF MOTION (ROM) AND STRETCHING EXERCISES - Plantar Fasciitis (Heel Spur Syndrome) These exercises  may help you when beginning to rehabilitate your injury. Your symptoms may resolve with or without further involvement from your physician, physical therapist or athletic trainer. While completing these exercises, remember:   Restoring tissue flexibility helps normal motion to return to the joints. This allows healthier, less painful movement and activity.  An effective stretch should be held for at least 30 seconds.  A stretch should never be painful. You should only feel a gentle lengthening or release in the stretched tissue.  RANGE OF MOTION - Toe Extension, Flexion  Sit with your right / left leg crossed over your opposite knee.  Grasp your toes and gently pull them back toward the top of your foot. You should feel a stretch on the bottom of your toes and/or foot.  Hold this stretch for 10 seconds.  Now, gently pull your toes toward the bottom of your foot. You should feel a stretch on the top of your toes and or foot.  Hold this stretch for 10 seconds. Repeat  times. Complete this stretch 3 times per day.   RANGE OF MOTION - Ankle Dorsiflexion, Active Assisted  Remove shoes and sit on a chair that is preferably not on a carpeted surface.  Place right / left foot under knee. Extend your opposite leg for support.  Keeping your heel down, slide your right / left foot back toward the chair until you feel a stretch at your ankle or calf. If you do not feel a stretch, slide your bottom forward to the edge of the chair, while still keeping your heel down.  Hold this stretch for 10 seconds. Repeat 3 times. Complete this stretch 2 times per day.   STRETCH  Gastroc, Standing  Place hands on wall.  Extend right / left leg, keeping the front knee somewhat bent.  Slightly point your toes inward on your back foot.  Keeping your right / left heel on the floor and your knee straight, shift your weight toward the wall, not allowing your back to arch.  You should feel a gentle stretch  in the right / left calf. Hold this position for 10 seconds. Repeat 3 times. Complete this stretch 2 times per day.  STRETCH  Soleus, Standing  Place hands on wall.  Extend right / left leg, keeping the other knee somewhat bent.  Slightly point your toes inward on your back foot.  Keep your right / left heel on the floor, bend your back knee, and slightly shift your weight over the back leg so that you feel a gentle stretch deep in your back calf.  Hold this position for 10 seconds. Repeat 3 times. Complete this stretch 2 times per day.  STRETCH  Gastrocsoleus, Standing  Note: This exercise can place a lot of stress on your foot and ankle. Please complete this exercise only if specifically instructed by your caregiver.   Place the ball of your right / left foot on a step, keeping your other foot firmly on the same step.  Hold on to the wall or a rail for balance.  Slowly lift your other foot, allowing your body weight to press your heel down over the edge of the step.  You should feel a stretch in your right / left calf.  Hold this position for 10 seconds.  Repeat this exercise with a slight bend in your right /   left knee. Repeat 3 times. Complete this stretch 2 times per day.   STRENGTHENING EXERCISES - Plantar Fasciitis (Heel Spur Syndrome)  These exercises may help you when beginning to rehabilitate your injury. They may resolve your symptoms with or without further involvement from your physician, physical therapist or athletic trainer. While completing these exercises, remember:   Muscles can gain both the endurance and the strength needed for everyday activities through controlled exercises.  Complete these exercises as instructed by your physician, physical therapist or athletic trainer. Progress the resistance and repetitions only as guided.  STRENGTH - Towel Curls  Sit in a chair positioned on a non-carpeted surface.  Place your foot on a towel, keeping your heel  on the floor.  Pull the towel toward your heel by only curling your toes. Keep your heel on the floor. Repeat 3 times. Complete this exercise 2 times per day.  STRENGTH - Ankle Inversion  Secure one end of a rubber exercise band/tubing to a fixed object (table, pole). Loop the other end around your foot just before your toes.  Place your fists between your knees. This will focus your strengthening at your ankle.  Slowly, pull your big toe up and in, making sure the band/tubing is positioned to resist the entire motion.  Hold this position for 10 seconds.  Have your muscles resist the band/tubing as it slowly pulls your foot back to the starting position. Repeat 3 times. Complete this exercises 2 times per day.  Document Released: 03/22/2005 Document Revised: 06/14/2011 Document Reviewed: 07/04/2008 [facility] Patient Information 2014 [place], [place]. Achilles Tendon Tear  The Achilles tendon is a cord-like band that connects the muscles of your lower leg (calf) to your heel. The Achilles tendon is the most common site of tendon tearing (rupture). What are the causes? This condition may be caused by: Stress from a sudden stretching of the tendon. For example, this may occur when you land from a jump or when your heel drops down into a hole on uneven ground. A hard, direct hit to the tendon. Pushing off your foot forcefully, such as when sprinting, jumping, or changing direction while running. What increases the risk? You are more likely to develop this condition if you: Are a runner. Play sports that involve sprinting, running, or jumping. Play contact sports. Have a weak Achilles tendon. Tendons can weaken from aging, repeat injuries, and chronic tendinitis. Are male. Are 38-25 years of age. Take a type of antibiotic medicine called quinolones. What are the signs or symptoms? Symptoms of this condition include: Hearing a noise, like a pop or a snap, at the time of injury. Severe,  sudden pain in the back of the ankle. Swelling and bruising. Inability to actively point your toes down. Pain when standing or walking. A feeling of giving way when you step on the affected foot. How is this diagnosed? This condition is usually diagnosed based on a physical exam. You may also have imaging tests, such as: Ultrasound. X-rays. MRI. How is this treated? This condition may be treated with: Rest. Ice applied to the area. Pain medicine. Crutches. A cast, splint, or other device to keep the ankle from moving (keep it immobilized). Heel wedges to reduce the stretch on your tendon as it heals. Surgery. This option may depend on your age and your activity level. Follow these instructions at home: Medicines Take over-the-counter and prescription medicines only as told by your health care provider. Ask your health care provider if the medicine prescribed  to you: Requires you to avoid driving or using heavy machinery. Can cause constipation. You may need to take these actions to prevent or treat constipation: Drink enough fluid to keep your urine pale yellow. Take over-the-counter or prescription medicines. Eat foods that are high in fiber, such as beans, whole grains, and fresh fruits and vegetables. Limit foods that are high in fat and processed sugars, such as fried or sweet foods. If you have a cast: Do not stick anything inside the cast to scratch your skin. Doing that increases your risk of infection. You may put lotion on dry skin around the edges of the cast. Do not put lotion on the skin underneath it. If you have a splint or brace: Wear it as told by your health care provider. Remove it only as told by your health care provider. Loosen it if your toes tingle, become numb, or turn cold and blue. If you have a cast, splint, or brace: Keep it clean and dry. Check the skin around it every day. Tell your health care provider about any concerns. Ask your health care  provider when it is safe to drive if you have it on a leg or foot that you use for driving. Bathing Do not take baths, swim, or use a hot tub until your health care provider approves. Ask your health care provider if you may take showers. You may only be allowed to take sponge baths. If the cast, splint, or brace is not waterproof: Do not let it get wet. Cover it with a watertight covering when you take a bath or shower. Managing pain, stiffness, and swelling  If directed, put ice on the injured area. If you have a removable splint or brace, remove it as told by your health care provider. Put ice in a plastic bag. Place a towel between your skin and the bag or between your cast and the bag. Leave the ice on for 20 minutes, 2-3 times a day. Move your toes often to avoid stiffness and to lessen swelling. Raise (elevate) the injured area above the level of your heart while you are sitting or lying down. Activity Return to your normal activities as told by your health care provider. Ask your health care provider what activities are safe for you. Do not use the injured leg to support your body weight until your health care provider says that you can. Use crutches as told by your health care provider. Ask your health care provider which exercises are safe for you. General instructions Do not put pressure on any part of a cast or splint until it is fully hardened. This may take several hours. Do not use any products that contain nicotine or tobacco, such as cigarettes, e-cigarettes, and chewing tobacco. These can delay healing. If you need help quitting, ask your health care provider. Use heel wedges if told by your health care provider. Keep all follow-up visits as told by your health care provider. This is important. How is this prevented? Do exercises exactly as told by your health care provider and adjust them as directed. Warm up and stretch before being active. Cool down and stretch after  being active. Rest between periods of activity. Use equipment that fits you. Contact a health care provider if you have: More pain and swelling. Pain that is not controlled with medicines. New symptoms. Symptoms that get worse. Problems moving your toes or foot. Warmth in your foot. A fever. Summary An Achilles tendon tear is an injury  that involves a tear in this tendon. This injury typically occurs in runners or athletes who play sports that involve sprinting, running, or jumping. An Achilles tendon tear causes sudden severe pain in your ankle and an inability to point your foot down. Treatment for this injury may include rest, ice, and pain medicines. In some cases, surgery may be needed. This information is not intended to replace advice given to you by your health care provider. Make sure you discuss any questions you have with your health care provider. Document Revised: 01/19/2018 Document Reviewed: 10/13/2017 Elsevier Patient Education  2020 [employer].

## 2019-10-01 ENCOUNTER — Other Ambulatory Visit: Payer: Self-pay | Admitting: Podiatry

## 2019-10-01 DIAGNOSIS — M7660 Achilles tendinitis, unspecified leg: Secondary | ICD-10-CM

## 2019-11-01 ENCOUNTER — Other Ambulatory Visit: Payer: Self-pay

## 2019-11-01 ENCOUNTER — Ambulatory Visit: Payer: BC Managed Care – PPO | Admitting: Podiatry

## 2019-11-01 ENCOUNTER — Encounter: Payer: Self-pay | Admitting: Podiatry

## 2019-11-01 DIAGNOSIS — M779 Enthesopathy, unspecified: Secondary | ICD-10-CM

## 2019-11-01 DIAGNOSIS — M7660 Achilles tendinitis, unspecified leg: Secondary | ICD-10-CM

## 2019-11-01 NOTE — Progress Notes (Signed)
Subjective:   Patient ID: Mario Cirri., male   DOB: 39 y.o.   MRN: [id number]   HPI Patient states he seems to still be having pain in his foot and it is hard to describe what.  He does try to be active and has lost 70 pounds over the last 8 months and is trying to do as much as he can   ROS      Objective:  Physical Exam  Neurovascular status intact and it appears that the pain is mostly in the sinus tarsi right with inflammation slight of the midfoot but I was unable to ascertain any other areas of acute discomfort.  It is quite tender when palpated     Assessment:  Inflammatory sinus tarsitis right     Plan:  H&P reviewed condition sterile prep done and injected the sinus tarsi right 3 mg Kenalog 5 mg Xylocaine advised on supportive therapy anti-inflammatories and reappoint for [place] to recheck as needed.  May require orthotics long-term

## 2019-11-29 ENCOUNTER — Encounter: Payer: Self-pay | Admitting: Podiatry

## 2019-11-29 ENCOUNTER — Other Ambulatory Visit: Payer: Self-pay

## 2019-11-29 ENCOUNTER — Ambulatory Visit: Payer: BC Managed Care – PPO | Admitting: Podiatry

## 2019-11-29 VITALS — Temp 97.3°F

## 2019-11-29 DIAGNOSIS — M779 Enthesopathy, unspecified: Secondary | ICD-10-CM

## 2019-11-29 NOTE — Progress Notes (Signed)
Subjective:   Patient ID: Mario Kelley., male   DOB: 39 y.o.   MRN: [id number]   HPI Patient states the injections seem to really help him for a period of time but he tried to run and it started to get sore again and patient would like to run if possible but is willing to just do walking if necessary   ROS      Objective:  Physical Exam  Neurovascular status intact with patient's sinus tarsi right improved from previous visit but still quite sore within the sinus tarsi itself     Assessment:  Chronic sinus tarsitis right with inflammation     Plan:  Standard prep went ahead today and I injected the sinus tarsi right 3 mg Kenalog 5 mg Xylocaine and I did apply a air fracture walker to completely immobilize.  I discussed that we will start him on a process to try to get him running again but I want him to start slowly walking first and then hopefully we can increase his activity levels and he will be seen back in 4 weeks to reevaluate

## 2021-08-18 ENCOUNTER — Other Ambulatory Visit (HOSPITAL_COMMUNITY): Payer: Self-pay | Admitting: Family Medicine

## 2021-08-18 ENCOUNTER — Ambulatory Visit (HOSPITAL_COMMUNITY)
Admission: RE | Admit: 2021-08-18 | Discharge: 2021-08-18 | Disposition: A | Discharge status: Home / Self Care | Payer: BC Managed Care – PPO | Source: Ambulatory Visit | Attending: Family Medicine | Admitting: Family Medicine

## 2021-08-18 DIAGNOSIS — S43006A Unspecified dislocation of unspecified shoulder joint, initial encounter: Secondary | ICD-10-CM

## 2021-08-27 ENCOUNTER — Other Ambulatory Visit: Payer: Self-pay | Admitting: Orthopedic Surgery

## 2021-08-27 DIAGNOSIS — M25511 Pain in right shoulder: Secondary | ICD-10-CM

## 2021-09-07 ENCOUNTER — Ambulatory Visit
Admission: RE | Admit: 2021-09-07 | Discharge: 2021-09-07 | Disposition: A | Discharge status: Home / Self Care | Payer: BC Managed Care – PPO | Source: Ambulatory Visit | Attending: Orthopedic Surgery | Admitting: Orthopedic Surgery

## 2021-09-07 DIAGNOSIS — M25511 Pain in right shoulder: Secondary | ICD-10-CM

## 2023-12-02 ENCOUNTER — Ambulatory Visit: Payer: Self-pay | Admitting: Allergy & Immunology

## 2023-12-02 ENCOUNTER — Encounter: Payer: Self-pay | Admitting: Allergy & Immunology

## 2023-12-02 ENCOUNTER — Other Ambulatory Visit: Payer: Self-pay

## 2023-12-02 VITALS — BP 140/80 | HR 105 | Temp 99.3°F | Resp 18 | Ht 77.0 in | Wt 292.8 lb

## 2023-12-02 DIAGNOSIS — Z91014 Allergy to mammalian meats: Secondary | ICD-10-CM | POA: Diagnosis not present

## 2023-12-02 MED ORDER — OMALIZUMAB 300 MG/2  ML ~~LOC~~ SOSY
300.0000 mg | PREFILLED_SYRINGE | Freq: Once | SUBCUTANEOUS | Status: AC
  Administered 2023-12-02: 300 mg via SUBCUTANEOUS

## 2023-12-02 MED ORDER — EPINEPHRINE 0.3 MG/0.3ML IJ SOAJ: 0.3000 mg | INTRAMUSCULAR | 2 refills | Status: AC | PRN

## 2023-12-02 NOTE — Patient Instructions (Addendum)
 1. Alpha-gal syndrome - I would strongly consider starting Xolair  to help with the cross contamination episodes.  - This will allow you to consume milk products without fewer/no reactions. - It MAY allow you to eat small amounts of meat, but you can experiment with that at your own risk. - EpiPen  training and Emergency Action Plan provided. - Tammy will reach out to discuss the approval process with you. - Most/all patients pay $0 out of pocket for this medication.   2. Return in about 6 months (around 06/02/2024). You can have the follow up appointment with Dr. Iva or a Nurse Practicioner (our Nurse Practitioners are excellent and always have Physician oversight!).   Please email labs to [email or web address] so that can put them into your chart.     Please inform us  of any Emergency Department visits, hospitalizations, or changes in symptoms. Call us  before going to the ED for breathing or allergy symptoms since we might be able to fit you in for a sick visit. Feel free to contact us  anytime with any questions, problems, or concerns.  It was a pleasure to meet you today!  Websites that have reliable patient information: 1. American Academy of Asthma, Allergy, and Immunology: www.aaaai.org 2. Food Allergy Research and Education (FARE): foodallergy.org 3. Mothers of Asthmatics: http://www.asthmacommunitynetwork.org 4. American College of Allergy, Asthma, and Immunology: www.acaai.org      "Like" us  on Facebook and Instagram for our latest updates!      A healthy democracy works best when [employer] participate! Make sure you are registered to vote! If you have moved or changed any of your contact information, you will need to get this updated before voting! Scan the QR codes below to learn more!

## 2023-12-02 NOTE — Progress Notes (Signed)
 NEW PATIENT  Date of Service/Encounter:  12/02/23  Consult requested by: Shona Norleen PEDLAR, MD   Assessment:   Alpha-gal syndrome  Plan/Recommendations:   1. Alpha-gal syndrome - I would strongly consider starting Xolair  to help with the cross contamination episodes.  - This will allow you to consume milk products without fewer/no reactions. - It MAY allow you to eat small amounts of meat, but you can experiment with that at your own risk. - EpiPen  training and Emergency Action Plan provided. - Tammy will reach out to discuss the approval process with you. - Most/all patients pay $0 out of pocket for this medication.   2. Return in about 6 months (around 06/02/2024). You can have the follow up appointment with Dr. Iva or a Nurse Practicioner (our Nurse Practitioners are excellent and always have Physician oversight!).   This note in its entirety was forwarded to the Provider who requested this consultation.  Subjective:   Mario Kelley. is a 43 y.o. male presenting today for evaluation of  Chief Complaint  Patient presents with   Establish Care    Patient states that he was diagnosed with alpha gal. Started to be able to eat the meats but recently started to have the issue again.     Travious Vanover German Kelley. has a history of the following: Patient Active Problem List   Diagnosis Date Noted   Fracture of triquetrum, closed 10/01/2014    History obtained from: chart review and patient.  Discussed the use of AI scribe software for clinical note transcription with the patient and/or guardian, who gave verbal consent to proceed.  Mario Kelley. was referred by Shona Norleen PEDLAR, MD.     Mario Kelley is a 43 y.o. male presenting for an evaluation of alpha gal syndrome.  Diagnosed with alpha-gal syndrome in 2020 after experiencing stomach issues for five years and developing hives, leading to testing. Initially symptom-free after avoiding all mammalian foods, but over the past  eight months to a year, he has experienced new symptoms including lower right abdominal pain, heart palpitations, anxiety, and shortness of breath during allergic reactions.  His heart rate experiences significant swings during reactions, and he has developed anxiety, which he had not experienced before. These reactions last six to seven days, similar to the duration of his previous stomach issues. Recent lab tests showed lower alpha-gal levels compared to his initial diagnosis, but they are still present.  He has been avoiding red meat for the past eight to nine weeks and is also unable to consume dairy products, which he finds particularly challenging. He takes a D3 supplement containing gelatin, which does not seem to cause issues.  He experienced a recent reaction after a cross-contamination incident while dining out, despite being cautious about his food choices and informing waitstaff of his allergy. 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  He has not been prescribed an EpiPen  and does not have a history of eczema or environmental allergies. He has not been outdoors as much recently but enjoys hiking and camping. 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   He otherwise has no atopic history.  Otherwise, there is no history of other atopic diseases, including asthma, drug allergies, environmental allergies, stinging insect allergies, eczema, urticaria, or contact dermatitis. There is no significant infectious history. Vaccinations are up to date.  Past Medical History: Patient Active Problem List   Diagnosis Date Noted   Fracture of triquetrum, closed 10/01/2014    Medication List:  Allergies as of 12/02/2023   No Known Allergies      Medication List        Accurate as of December 02, 2023  4:49 PM. If you have any questions, ask your nurse or  doctor.          amLODipine-olmesartan 5-20 MG tablet Commonly known as: AZOR Take 1 tablet by mouth daily.   cetirizine 10 MG tablet Commonly known as: ZYRTEC Take by mouth.   cyanocobalamin 1000 MCG/ML injection Commonly known as: VITAMIN B12 Inject 1,000 mcg into the muscle once a week.   diclofenac  Sodium 1 % Gel Commonly known as: VOLTAREN  Apply 4 g topically 4 (four) times daily. Rub into affected area of foot 2 to 4 times daily   EPINEPHrine  0.3 mg/0.3 mL Soaj injection Commonly known as: EpiPen  2-Pak Inject 0.3 mg into the muscle as needed for anaphylaxis. Started by: Marty Morton Shaggy   pantoprazole 40 MG tablet Commonly known as: PROTONIX Take 1 tablet by mouth daily.   testosterone cypionate 100 MG/ML injection Commonly known as: DEPOTESTOTERONE CYPIONATE Inject 200 mg into the muscle every 14 (fourteen) days. For IM use only   Vitamin D3 50 MCG (2000 UT) Tabs Take 1 tablet by mouth daily.        Birth History: non-contributory  Developmental History: non-contributory  Past Surgical History: Past Surgical History:  Procedure Laterality Date   CLOSED REDUCTION WRIST FRACTURE     right wrist   VARICOCELECTOMY  2011     Family History: Family History  Problem Relation Age of Onset   Diabetes Father    Diabetes Sister    Allergic rhinitis Maternal Aunt    Heart disease Paternal Aunt    Diabetes Maternal Grandfather    Diabetes Paternal Grandmother    Colon cancer Neg Hx    Colon polyps Neg Hx    Esophageal cancer Neg Hx    Rectal cancer Neg Hx    Stomach cancer Neg Hx      Social History: Mario Kelley lives at home with his family.  They live in a house that is 43 years old.  There is hardwood throughout the home.  They have gas heating and central cooling.  There are no animals inside or outside of the home.  There are dust mite covers on the bed, but not the pillows.  There is no tobacco exposure.  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  There is no fume, chemical, or dust exposure.  There is no HEPA filter.   Review of systems otherwise negative other than that mentioned in the HPI.    Objective:   Blood pressure (!) 140/80, pulse (!) 105, temperature 99.3 F (37.4 C), temperature source Temporal, resp. rate 18, height 6' 5 (1.956 m), weight 292 lb 12.8 oz (132.8 kg), SpO2 98%. Body mass index is 34.72 kg/m.     Physical Exam Vitals reviewed.  Constitutional:      Appearance: He is well-developed.     Comments: Lots of hair.   HENT:     Head: Normocephalic and atraumatic.     Right Ear: Tympanic membrane, ear canal and external ear normal. No drainage, swelling or tenderness. Tympanic membrane is not injected, scarred, erythematous, retracted or bulging.     Left Ear: Tympanic membrane, ear canal and external ear  normal. No drainage, swelling or tenderness. Tympanic membrane is not injected, scarred, erythematous, retracted or bulging.     Nose: No nasal deformity, septal deviation, mucosal edema or rhinorrhea.     Right Turbinates: Not enlarged or swollen.     Left Turbinates: Not enlarged or swollen.     Right Sinus: No maxillary sinus tenderness or frontal sinus tenderness.     Left Sinus: No maxillary sinus tenderness or frontal sinus tenderness.     Mouth/Throat:     Mouth: Mucous membranes are not pale and not dry.     Pharynx: Uvula midline.  Eyes:     General:        Right eye: No discharge.        Left eye: No discharge.     Conjunctiva/sclera: Conjunctivae normal.     Right eye: Right conjunctiva is not injected. No chemosis.    Left eye: Left conjunctiva is not injected. No chemosis.    Pupils: Pupils are equal, round, and reactive to light.  Cardiovascular:     Rate and Rhythm: Normal rate and regular rhythm.     Heart sounds: Normal heart sounds.  Pulmonary:     Effort: Pulmonary effort is normal. No tachypnea, accessory muscle usage or respiratory distress.     Breath  sounds: Normal breath sounds. No wheezing, rhonchi or rales.  Chest:     Chest wall: No tenderness.  Abdominal:     Tenderness: There is no abdominal tenderness. There is no guarding or rebound.  Lymphadenopathy:     Head:     Right side of head: No submandibular, tonsillar or occipital adenopathy.     Left side of head: No submandibular, tonsillar or occipital adenopathy.     Cervical: No cervical adenopathy.  Skin:    Coloration: Skin is not pale.     Findings: No abrasion, erythema, petechiae or rash. Rash is not papular, urticarial or vesicular.  Neurological:     Mental Status: He is alert.  Psychiatric:        Behavior: Behavior is cooperative.      Diagnostic studies: none   He did receive 600 mg of Xolair  in the clinic.  He was monitored for 30 minutes after the injection and tolerated it without adverse event.        Marty Shaggy, MD [facility] 

## 2023-12-08 NOTE — Progress Notes (Signed)
 Will need to IgE and Alpha Gal tests do we have these. I was unable to locate

## 2023-12-15 ENCOUNTER — Telehealth: Payer: Self-pay | Admitting: *Deleted

## 2023-12-15 NOTE — Telephone Encounter (Signed)
-----   Message from Marty Morton Shaggy sent at 12/02/2023  2:33 PM EDT ----- Xolair  for food allergies. IgE in April 2025 357. I think that is 525mg  every 2 weeks.

## 2023-12-15 NOTE — Telephone Encounter (Signed)
 Patient called about status of his approval for Xolair  for food allergy. I advised him I am awaiting the labs tests in order to send for approval

## 2023-12-21 NOTE — Telephone Encounter (Signed)
 Called patient and advised aprpoval, copay card and submit to [employer]. Will reach out to patient once delivery set for next appt. Has to get a least 3 injs in clinic then can go to home admin if he wishes to.

## 2024-01-09 ENCOUNTER — Ambulatory Visit (INDEPENDENT_AMBULATORY_CARE_PROVIDER_SITE_OTHER)

## 2024-01-09 DIAGNOSIS — Z91018 Allergy to other foods: Secondary | ICD-10-CM | POA: Diagnosis not present

## 2024-01-09 MED ORDER — OMALIZUMAB 300 MG/2  ML ~~LOC~~ SOSY
525.0000 mg | PREFILLED_SYRINGE | SUBCUTANEOUS | Status: AC
  Administered 2024-01-09 – 2024-02-06 (×3): 525 mg via SUBCUTANEOUS

## 2024-01-23 ENCOUNTER — Ambulatory Visit

## 2024-01-23 DIAGNOSIS — Z91018 Allergy to other foods: Secondary | ICD-10-CM | POA: Diagnosis not present

## 2024-02-06 ENCOUNTER — Ambulatory Visit (INDEPENDENT_AMBULATORY_CARE_PROVIDER_SITE_OTHER)

## 2024-02-06 DIAGNOSIS — Z91018 Allergy to other foods: Secondary | ICD-10-CM

## 2024-02-06 NOTE — Progress Notes (Signed)
 Immunotherapy   Patient Details  Name: Mario Kelley. MRN: [id number] Date of Birth: 1980-11-29  02/06/2024  Mario Kelley. Has received Xolair  Injection teaching. He has administered injections in office. Informed to rotate sites for injections. Informed to let us  know if he has issues. Informed patient to change shipment address to home Consent signed and patient instructions given. Next dose was in office and has been given to take home.    Hadassah HERO Mendez-Mungaray 02/06/2024, 3:36 PM

## 2024-06-27 ENCOUNTER — Ambulatory Visit: Admitting: Allergy & Immunology
